# Patient Record
Sex: Female | Born: 1935 | Race: White | Hispanic: No | Marital: Married | Smoking: Former smoker
Health system: Southern US, Community
[De-identification: ages and names within clinical notes are randomized; demographics above are authoritative.]

## PROBLEM LIST (undated history)

## (undated) DIAGNOSIS — H3550 Unspecified hereditary retinal dystrophy: Secondary | ICD-10-CM

## (undated) DIAGNOSIS — I219 Acute myocardial infarction, unspecified: Secondary | ICD-10-CM

## (undated) DIAGNOSIS — C50919 Malignant neoplasm of unspecified site of unspecified female breast: Secondary | ICD-10-CM

## (undated) HISTORY — PX: CORONARY ANGIOPLASTY WITH STENT PLACEMENT: SHX49

## (undated) HISTORY — PX: CORNEAL TRANSPLANT: SHX108

---

## 2017-07-17 ENCOUNTER — Ambulatory Visit (HOSPITAL_COMMUNITY)
Admission: EM | Admit: 2017-07-17 | Discharge: 2017-07-18 | Disposition: A | Discharge status: Home / Self Care | Payer: Medicare Other | Attending: Surgery | Admitting: Surgery

## 2017-07-17 ENCOUNTER — Emergency Department (HOSPITAL_COMMUNITY): Payer: Medicare Other | Admitting: Anesthesiology

## 2017-07-17 ENCOUNTER — Encounter: Payer: Self-pay | Admitting: Surgery

## 2017-07-17 ENCOUNTER — Emergency Department (HOSPITAL_COMMUNITY): Payer: Medicare Other

## 2017-07-17 ENCOUNTER — Ambulatory Visit: Payer: Self-pay | Admitting: Surgery

## 2017-07-17 ENCOUNTER — Encounter (HOSPITAL_COMMUNITY): Payer: Self-pay | Admitting: Emergency Medicine

## 2017-07-17 ENCOUNTER — Encounter (HOSPITAL_COMMUNITY): Admission: EM | Disposition: A | Discharge status: Home / Self Care | Payer: Self-pay | Source: Home / Self Care

## 2017-07-17 ENCOUNTER — Other Ambulatory Visit: Payer: Self-pay | Admitting: Surgery

## 2017-07-17 DIAGNOSIS — Y93K1 Activity, walking an animal: Secondary | ICD-10-CM | POA: Diagnosis not present

## 2017-07-17 DIAGNOSIS — S42293A Other displaced fracture of upper end of unspecified humerus, initial encounter for closed fracture: Secondary | ICD-10-CM | POA: Insufficient documentation

## 2017-07-17 DIAGNOSIS — W19XXXA Unspecified fall, initial encounter: Secondary | ICD-10-CM | POA: Insufficient documentation

## 2017-07-17 DIAGNOSIS — Z23 Encounter for immunization: Secondary | ICD-10-CM | POA: Insufficient documentation

## 2017-07-17 DIAGNOSIS — Z7982 Long term (current) use of aspirin: Secondary | ICD-10-CM | POA: Diagnosis not present

## 2017-07-17 DIAGNOSIS — H3550 Unspecified hereditary retinal dystrophy: Secondary | ICD-10-CM | POA: Diagnosis not present

## 2017-07-17 DIAGNOSIS — S0531XA Ocular laceration without prolapse or loss of intraocular tissue, right eye, initial encounter: Secondary | ICD-10-CM

## 2017-07-17 DIAGNOSIS — I252 Old myocardial infarction: Secondary | ICD-10-CM | POA: Insufficient documentation

## 2017-07-17 DIAGNOSIS — Z947 Corneal transplant status: Secondary | ICD-10-CM | POA: Diagnosis not present

## 2017-07-17 DIAGNOSIS — Z853 Personal history of malignant neoplasm of breast: Secondary | ICD-10-CM | POA: Insufficient documentation

## 2017-07-17 DIAGNOSIS — Z87891 Personal history of nicotine dependence: Secondary | ICD-10-CM | POA: Diagnosis not present

## 2017-07-17 DIAGNOSIS — S42291A Other displaced fracture of upper end of right humerus, initial encounter for closed fracture: Secondary | ICD-10-CM

## 2017-07-17 DIAGNOSIS — Z955 Presence of coronary angioplasty implant and graft: Secondary | ICD-10-CM | POA: Diagnosis not present

## 2017-07-17 DIAGNOSIS — Z79899 Other long term (current) drug therapy: Secondary | ICD-10-CM | POA: Diagnosis not present

## 2017-07-17 DIAGNOSIS — S0530XA Ocular laceration without prolapse or loss of intraocular tissue, unspecified eye, initial encounter: Secondary | ICD-10-CM | POA: Insufficient documentation

## 2017-07-17 HISTORY — PX: RUPTURED GLOBE EXPLORATION AND REPAIR: SHX2366

## 2017-07-17 HISTORY — DX: Acute myocardial infarction, unspecified: I21.9

## 2017-07-17 HISTORY — DX: Malignant neoplasm of unspecified site of unspecified female breast: C50.919

## 2017-07-17 HISTORY — DX: Unspecified hereditary retinal dystrophy: H35.50

## 2017-07-17 SURGERY — REPAIR, RUPTURE, GLOBE
Anesthesia: General | Site: Eye | Laterality: Right

## 2017-07-17 MED ORDER — FENTANYL CITRATE (PF) 250 MCG/5ML IJ SOLN
INTRAMUSCULAR | Status: AC
  Filled 2017-07-17: qty 5

## 2017-07-17 MED ORDER — PROPOFOL 10 MG/ML IV BOLUS
INTRAVENOUS | Status: DC | PRN
  Administered 2017-07-17: 100 mg via INTRAVENOUS

## 2017-07-17 MED ORDER — LEVOFLOXACIN IN D5W 750 MG/150ML IV SOLN
750.0000 mg | Freq: Once | INTRAVENOUS | Status: AC
  Administered 2017-07-17: 750 mg via INTRAVENOUS
  Filled 2017-07-17: qty 150

## 2017-07-17 MED ORDER — SODIUM HYALURONATE 10 MG/ML IO SOLN
INTRAOCULAR | Status: AC
  Filled 2017-07-17: qty 0.85

## 2017-07-17 MED ORDER — BUPIVACAINE HCL (PF) 0.75 % IJ SOLN
INTRAMUSCULAR | Status: AC
  Filled 2017-07-17: qty 30

## 2017-07-17 MED ORDER — BSS PLUS IO SOLN: INTRAOCULAR | Status: DC | PRN

## 2017-07-17 MED ORDER — DEXAMETHASONE SODIUM PHOSPHATE 10 MG/ML IJ SOLN
INTRAMUSCULAR | Status: DC | PRN
  Administered 2017-07-17: 10 mg via INTRAVENOUS

## 2017-07-17 MED ORDER — ROCURONIUM BROMIDE 100 MG/10ML IV SOLN
INTRAVENOUS | Status: DC | PRN
  Administered 2017-07-17: 50 mg via INTRAVENOUS

## 2017-07-17 MED ORDER — MEPERIDINE HCL 25 MG/ML IJ SOLN: 6.2500 mg | INTRAMUSCULAR | Status: DC | PRN

## 2017-07-17 MED ORDER — SUGAMMADEX SODIUM 200 MG/2ML IV SOLN
INTRAVENOUS | Status: DC | PRN
  Administered 2017-07-17: 200 mg via INTRAVENOUS

## 2017-07-17 MED ORDER — DEXAMETHASONE SODIUM PHOSPHATE 10 MG/ML IJ SOLN
INTRAMUSCULAR | Status: AC
  Filled 2017-07-17: qty 1

## 2017-07-17 MED ORDER — CEFAZOLIN SODIUM-DEXTROSE 1-4 GM/50ML-% IV SOLN
1.0000 g | Freq: Once | INTRAVENOUS | Status: DC
  Filled 2017-07-17: qty 50

## 2017-07-17 MED ORDER — SUGAMMADEX SODIUM 200 MG/2ML IV SOLN
INTRAVENOUS | Status: AC
  Filled 2017-07-17: qty 2

## 2017-07-17 MED ORDER — TOBRAMYCIN-DEXAMETHASONE 0.3-0.1 % OP OINT
TOPICAL_OINTMENT | OPHTHALMIC | Status: AC
  Filled 2017-07-17: qty 3.5

## 2017-07-17 MED ORDER — HYDROMORPHONE HCL 1 MG/ML IJ SOLN: 0.2500 mg | INTRAMUSCULAR | Status: DC | PRN

## 2017-07-17 MED ORDER — PHENYLEPHRINE HCL 10 MG/ML IJ SOLN
INTRAVENOUS | Status: DC | PRN
  Administered 2017-07-17: 30 ug/min via INTRAVENOUS

## 2017-07-17 MED ORDER — FLUORESCEIN SODIUM 0.6 MG OP STRP
ORAL_STRIP | OPHTHALMIC | Status: AC
  Filled 2017-07-17: qty 1

## 2017-07-17 MED ORDER — FENTANYL CITRATE (PF) 100 MCG/2ML IJ SOLN
INTRAMUSCULAR | Status: DC | PRN
  Administered 2017-07-17: 150 ug via INTRAVENOUS

## 2017-07-17 MED ORDER — ROCURONIUM BROMIDE 10 MG/ML (PF) SYRINGE
PREFILLED_SYRINGE | INTRAVENOUS | Status: AC
  Filled 2017-07-17: qty 5

## 2017-07-17 MED ORDER — LACTATED RINGERS IV SOLN
INTRAVENOUS | Status: DC | PRN
  Administered 2017-07-17: 21:00:00 via INTRAVENOUS

## 2017-07-17 MED ORDER — GATIFLOXACIN 0.5 % OP SOLN
OPHTHALMIC | Status: AC
  Filled 2017-07-17: qty 2.5

## 2017-07-17 MED ORDER — ONDANSETRON HCL 4 MG/2ML IJ SOLN
INTRAMUSCULAR | Status: AC
  Filled 2017-07-17: qty 2

## 2017-07-17 MED ORDER — PHENYLEPHRINE HCL 10 MG/ML IJ SOLN
INTRAMUSCULAR | Status: DC | PRN
  Administered 2017-07-17 (×2): 80 ug via INTRAVENOUS

## 2017-07-17 MED ORDER — LIDOCAINE HCL (CARDIAC) 20 MG/ML IV SOLN
INTRAVENOUS | Status: DC | PRN
  Administered 2017-07-17: 100 mg via INTRAVENOUS

## 2017-07-17 MED ORDER — ONDANSETRON HCL 4 MG/2ML IJ SOLN
INTRAMUSCULAR | Status: DC | PRN
  Administered 2017-07-17: 4 mg via INTRAVENOUS

## 2017-07-17 MED ORDER — BSS IO SOLN
INTRAOCULAR | Status: AC
  Filled 2017-07-17: qty 500

## 2017-07-17 MED ORDER — BSS IO SOLN
INTRAOCULAR | Status: AC
  Filled 2017-07-17: qty 15

## 2017-07-17 MED ORDER — LIDOCAINE 2% (20 MG/ML) 5 ML SYRINGE
INTRAMUSCULAR | Status: AC
  Filled 2017-07-17: qty 5

## 2017-07-17 MED ORDER — PROPOFOL 10 MG/ML IV BOLUS
INTRAVENOUS | Status: AC
  Filled 2017-07-17: qty 20

## 2017-07-17 MED ORDER — PHENYLEPHRINE 40 MCG/ML (10ML) SYRINGE FOR IV PUSH (FOR BLOOD PRESSURE SUPPORT)
PREFILLED_SYRINGE | INTRAVENOUS | Status: AC
  Filled 2017-07-17: qty 10

## 2017-07-17 MED ORDER — FENTANYL CITRATE (PF) 100 MCG/2ML IJ SOLN
50.0000 ug | Freq: Once | INTRAMUSCULAR | Status: AC
  Administered 2017-07-17: 50 ug via INTRAVENOUS
  Filled 2017-07-17: qty 2

## 2017-07-17 MED ORDER — 0.9 % SODIUM CHLORIDE (POUR BTL) OPTIME
TOPICAL | Status: DC | PRN
  Administered 2017-07-17: 1000 mL

## 2017-07-17 MED ORDER — FLUORESCEIN SODIUM 1 MG OP STRP
ORAL_STRIP | OPHTHALMIC | Status: DC | PRN
  Administered 2017-07-17: 1 via OPHTHALMIC

## 2017-07-17 MED ORDER — TETANUS-DIPHTH-ACELL PERTUSSIS 5-2.5-18.5 LF-MCG/0.5 IM SUSP
0.5000 mL | Freq: Once | INTRAMUSCULAR | Status: AC
  Administered 2017-07-17: 0.5 mL via INTRAMUSCULAR
  Filled 2017-07-17: qty 0.5

## 2017-07-17 MED ORDER — SODIUM HYALURONATE 10 MG/ML IO SOLN
INTRAOCULAR | Status: DC | PRN
  Administered 2017-07-17 (×2): 0.85 mL via INTRAOCULAR

## 2017-07-17 MED ORDER — BSS IO SOLN
INTRAOCULAR | Status: DC | PRN
  Administered 2017-07-17 (×2): 15 mL via INTRAOCULAR

## 2017-07-17 MED ORDER — ONDANSETRON HCL 4 MG/2ML IJ SOLN
4.0000 mg | Freq: Once | INTRAMUSCULAR | Status: AC | PRN
  Administered 2017-07-18: 4 mg via INTRAVENOUS

## 2017-07-17 SURGICAL SUPPLY — 9 items
DRAPE OPHTHALMIC 77X100 STRL (CUSTOM PROCEDURE TRAY) ×3 IMPLANT
GLOVE BIO SURGEON STRL SZ7 (GLOVE) ×6 IMPLANT
GLOVE BIOGEL PI IND STRL 7.0 (GLOVE) ×1 IMPLANT
GLOVE BIOGEL PI INDICATOR 7.0 (GLOVE) ×2
GLOVE SURG SS PI 7.0 STRL IVOR (GLOVE) ×3 IMPLANT
STOCKINETTE 6  STRL (DRAPES) ×4
STOCKINETTE 6 STRL (DRAPES) ×2 IMPLANT
SUT ETHILON 10 0 BV100 4 (SUTURE) ×12 IMPLANT
SUT ETHILON 10 0 BV75 4 (SUTURE) ×24 IMPLANT

## 2017-07-17 NOTE — Progress Notes (Signed)
Orthopedic Tech Progress Note Patient Details:  Katie Nguyen [birth year] [id number]  Ortho Devices Type of Ortho Device: Sling immobilizer Ortho Device/Splint Location: rue Ortho Device/Splint Interventions: Ordered, Application, Adjustment   Post Interventions Patient Tolerated: Well Instructions Provided: Care of device, Adjustment of device   Karolee Stamps 07/17/2017, 8:38 PM

## 2017-07-17 NOTE — Consult Note (Signed)
CC:  Chief Complaint  Patient presents with  . Fall  . Eye injury    HPI: Katie Nguyen is a 83 y.o. 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   Reportedly, she was yanked by her dog to the right and fell on her right shoulder and hit her face.   There is bleeding and vision is described as "seeing red."  ROS: Denies fever/chills, unintentional weight loss, chest pain, irregular heart rhythm, SOB, cough, wheezing, abdominal pain, melena, hematochezia, weakness, numbness, slurring of speech, facial droop, muscle weakness, joint pain, skin rash, tattoos, depressed mood  PMH: Past Medical History:  Diagnosis Date  . Breast cancer ([place])    Left  . MI (myocardial infarction) ([facility])   . 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 CORNEAL TRANSPLANT Bilateral   . CORONARY ANGIOPLASTY WITH STENT PLACEMENT      Meds: No current facility-administered medications on file prior to encounter.    No current outpatient medications on file prior to encounter.    SH: Social History   Socioeconomic History  . Marital status: Married    Spouse name: None  . Number of children: None  . Years of education: None  . Highest education level: None  Social Needs  . Financial resource strain: None  . Food insecurity - worry: None  . Food insecurity - inability: None  . Transportation needs - medical: None  . Transportation needs - non-medical: None  Occupational History  . None  Tobacco Use  . Smoking status: Former Research scientist (life sciences)  . Smokeless tobacco: Never Used  Substance and Sexual Activity  . Alcohol use: Yes    Comment: socially, one drink a day  . Drug use: No  . Sexual activity: None  Other Topics Concern  . None  Social History Narrative  . None    FH: History reviewed. No pertinent family history.  Exam:  Lucianne Lei: OD:  HM @ 39feet  OS: 16 pt ([place])  CVF: OD: Unable OS: full  EOM: OD: full d/v OS: full d/v  Pupils: OD: Irregular, non-reactive, no APD by reverse OS: 3->2 mm, no APD  IOP: by [place] OD: deferred OS: 10  External: OD: 1+ periorbital edema, no proptosis, +enophthalmos OS: no periorbital edema, no proptosis, V1-V3 intact and symmetric, good orbicularis strength  Pen Light Exam: L/L: OD: WNL, no lacerations OS: WNL  C/S: OD: white and quiet, no obvious conjunctival laceration OS: white and quiet  K: OD: mucus and blood tinged, hazy, open - corneal button dislocated inferiorly OS: clear, no abnormal staining  A/C: OD: malformed, shallow OS: grossly deep and quiet appearing by pen light  I: OD: iris to the wound superiorly and inferiorly OS: round and regular  L: OD: IOL (uncertain location) OS: PCIOL  DFE: dilated @ 7:50  w/ Tropic and Phenyl OS  V: OD: deferred OS: clear  N: OD: deferred OS: C/D 0.55, no disc edema  M: OD: deferred OS: flat, no obvious macular pathology  V: OD: deferred OS: normal appearing vessels  P: OD: deferred OS: retina flat 360, no obvious mass/RT/RD   CT Head/Face (07/17/17): - Air in the globe with deformed right globe  - No obvious bony deformities  A/P:  1. Ruptured Globe, Right eye - History of PK with corneal button dislocated inferiorly -  NPO since 2PM - CT reveals air in the anterior segment - Will need to go to the OR emergently for globe repair   - Discussed R/B/A of globe repair with the patient at length, she is agreeable and willing to proceed.

## 2017-07-17 NOTE — ED Triage Notes (Signed)
Pt presents to ED for assessment after being yanked by her dog to the right falling on the right shoulder and hitting her face.  Recent fall as well from a leg cramp with an old bruise to the right forehead.  Pt's eye is injured, EMS has it shielded at this time.  States right eye is bleeding and oozing, is smaller in volume than the left, and loss of vision ("all I see if veiny red stuff").  Pt also c/o right shoulder pain with some deformity.

## 2017-07-17 NOTE — H&P (Signed)
Katie Nguyen is an 83 y.o. female.   Chief Complaint: Ruptured Globe, right eye HPI: Katie Nguyen is a 83 y.o. female with POHx of PK OU and CE/IOL OU who fell on her right side today. She presented to the ER with seeing red OD. Examination with CT imaging revealed ruptured globe of the right eye. No recent chest pain, SOB.   Past Medical History:  Diagnosis Date  . Breast cancer ([facility])    Left  . MI (myocardial infarction) ([facility])   . 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 CORNEAL TRANSPLANT Bilateral   . CORONARY ANGIOPLASTY WITH STENT PLACEMENT      No family history on file. Social History:  reports that she has quit smoking. she has never used smokeless tobacco. She reports that she drinks alcohol. She reports that she does not use drugs.  Allergies:  Allergies  Allergen Reactions  . Codeine Hives     (Not in a hospital admission)  No results found for this or any previous visit (from the past 48 hour(s)). Dg Shoulder Right  Result Date: 07/17/2017 CLINICAL DATA:  Pain after trauma EXAM: RIGHT SHOULDER - 2+ VIEW COMPARISON:  None. FINDINGS: There is a comminuted fracture through the humeral head. The humeral head is displaced inferiorly suggesting a joint effusion. Evaluation for dislocation is limited due to a suboptimal transscapular Y-view. However, there is no imaging evidence of dislocation on this study. The scapula and clavicle are intact. Limited views of the right chest are normal. IMPRESSION: Comminuted fracture of the humeral head. Limited evaluation for dislocation. However, no dislocation identified. Electronically Signed   By: Dorise Bullion III M.D   On: 07/17/2017 19:39   Dg Humerus Right  Result Date: 07/17/2017 CLINICAL DATA:  Pain after fall EXAM: RIGHT HUMERUS - 2+ VIEW COMPARISON:  None. FINDINGS: There is a comminuted fracture through the humeral head. The remainder of the humerus is  intact. Limited views of the proximal radius and ulna demonstrate no definitive abnormality. Dedicated images would be more sensitive however. No joint effusions seen in the elbow. IMPRESSION: Comminuted fracture through the humeral head. No other humeral fractures identified. Electronically Signed   By: Dorise Bullion III M.D   On: 07/17/2017 19:40    ROS   - Please see consult note.   There were no vitals taken for this visit. Physical Exam   Constitutional: AAO X3 HEENT: Ruptured globe, right eye CVS: S1, S2 Lungs: CTAB Abd: Soft Extremities: No TTP  Assessment/Plan 1. Ruptured Globe, right eye  - Recommend emergent repair of globe rupture (history of PK) - Discussed R/B/A of repair with patient, who is willing to proceed - Reviewed likely potential for poor visual outcome with globe rupture even with repair  Marshall Cork, MD,[facility] Ophthalmology [phone]  Hortencia Pilar, MD 07/17/2017, 7:49 PM

## 2017-07-17 NOTE — Transfer of Care (Signed)
Immediate Anesthesia Transfer of Care Note  Patient: Katie Nguyen  Procedure(s) Performed: REPAIR OF RUPTURED GLOBE (Right Eye)  Patient Location: PACU  Anesthesia Type:General  Level of Consciousness: awake, alert  and oriented  Airway & Oxygen Therapy: Patient Spontanous Breathing and Patient connected to nasal cannula oxygen  Post-op Assessment: Report given to RN and Post -op Vital signs reviewed and stable  Post vital signs: Reviewed and stable  Last Vitals:  Vitals:   07/17/17 1845 07/17/17 1900  BP:    Pulse: 76 70  Resp:    Temp:    SpO2: 99% 98%    Last Pain:  Vitals:   07/17/17 2024  TempSrc:   PainSc: 2          Complications: No apparent anesthesia complications

## 2017-07-17 NOTE — ED Notes (Signed)
Ortho aware of need for splinting

## 2017-07-17 NOTE — Anesthesia Procedure Notes (Signed)
Procedure Name: Intubation Date/Time: 07/17/2017 9:46 PM Performed by: Babs Bertin, CRNA Pre-anesthesia Checklist: Patient identified, Emergency Drugs available, Suction available and Patient being monitored Patient Re-evaluated:Patient Re-evaluated prior to induction Oxygen Delivery Method: Circle System Utilized Preoxygenation: Pre-oxygenation with 100% oxygen Induction Type: IV induction Ventilation: Mask ventilation without difficulty Laryngoscope Size: Mac and 3 Grade View: Grade I Tube type: Oral Tube size: 7.0 mm Number of attempts: 1 Airway Equipment and Method: Stylet and Oral airway Placement Confirmation: ETT inserted through vocal cords under direct vision,  positive ETCO2 and breath sounds checked- equal and bilateral Secured at: 20 cm Tube secured with: Tape Dental Injury: Teeth and Oropharynx as per pre-operative assessment

## 2017-07-17 NOTE — Discharge Instructions (Signed)
Please make sure you wear the sling and follow-up with orthopedic doctor.  Have given you a referral to Dr. Lorin Mercy we talked today about the break in her shoulder.

## 2017-07-17 NOTE — ED Notes (Signed)
Pt continues to be in CT

## 2017-07-17 NOTE — ED Provider Notes (Signed)
[facility] Provider Note   CSN: [id number] Arrival date & time: 07/17/17  1821     History   Chief Complaint Chief Complaint  Patient presents with  . Fall  . Eye injury    HPI Katie Nguyen is a 83 y.o. female.  HPI 83 year old Caucasian female past medical history significant for MI, bilateral corneal transplant that presents to the emergency department today for evaluation following a fall.  Patient states that she was walking her dog when the dog yanked her causing her to fall and land onto her right shoulder and the right side of her head and face.  Patient brought in by EMS who notes that patient had blood coming from her right eye.  Patient reports no vision in her right eye.  She also reports some pain with palpation range of motion right shoulder.  Denies any other pain.  Patient is from out of town and visiting family.  Patient does not take anything for the pain prior to arrival.  Denies any associated paresthesias or weakness of the right arm.  She denies any headaches, lightheadedness, dizziness, chest pain, abdominal pain, lower extremity paresthesias, back pain, neck pain. Past Medical History:  Diagnosis Date  . Breast cancer ([place])    Left  . MI (myocardial infarction) ([place])   . 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 Ruptured globe 07/17/2017    Past Surgical History:  Procedure Laterality Date  . CORNEAL TRANSPLANT Bilateral   . CORONARY ANGIOPLASTY WITH STENT PLACEMENT      OB History    No data available       Home Medications    Prior to Admission medications   Medication Sig Start Date End Date Taking? Authorizing Provider  aspirin EC 81 MG tablet Take 81 mg by mouth daily.   Yes [provider]  cholecalciferol (VITAMIN D) 1000 units tablet Take 1,000 Units by mouth daily.   Yes [provider]  gabapentin (NEURONTIN) 100 MG capsule Take 100  mg by mouth 3 (three) times daily. 07/08/17  Yes [provider]  levothyroxine (SYNTHROID, LEVOTHROID) 88 MCG tablet Take 88 mcg by mouth at bedtime. 05/25/17  Yes [provider]  metoprolol succinate (TOPROL-XL) 25 MG 24 hr tablet Take 25 mg by mouth at bedtime. 07/08/17  Yes [provider]  Multiple Vitamin (MULTIVITAMIN) tablet Take 1 tablet by mouth daily.   Yes [provider]  omega-3 acid ethyl esters (LOVAZA) 1 g capsule Take 1 g by mouth daily.   Yes [provider]  rosuvastatin (CRESTOR) 20 MG tablet Take 20 mg by mouth at bedtime. 07/08/17  Yes [provider]  vitamin B-12 (CYANOCOBALAMIN) 500 MCG tablet Take 100 mcg by mouth daily.   Yes [provider]  zolpidem (AMBIEN) 5 MG tablet Take 5 mg by mouth at bedtime as needed for sleep.   Yes [provider]    Family History History reviewed. No pertinent family history.  Social History Social History   Tobacco Use  . Smoking status: Former Research scientist (life sciences)  . Smokeless tobacco: Never Used  Substance Use Topics  . Alcohol use: Yes    Comment: socially, one drink a day  . Drug use: No     Allergies   Codeine   Review of Systems Review of Systems  Constitutional: Negative for chills and fever.  HENT: Negative for congestion.  Eyes: Positive for pain, discharge and visual disturbance.  Respiratory: Negative for cough and shortness of breath.   Cardiovascular: Negative for chest pain.  Gastrointestinal: Negative for abdominal pain, diarrhea, nausea and vomiting.  Genitourinary: Negative for dysuria, flank pain, frequency, hematuria and urgency.  Musculoskeletal: Positive for arthralgias and joint swelling. Negative for myalgias.  Skin: Negative for rash.  Neurological: Negative for dizziness, syncope, weakness, light-headedness, numbness and headaches.  Psychiatric/Behavioral: Negative for sleep disturbance. The patient is not nervous/anxious.       Physical Exam Updated Vital Signs BP (!) 153/71 (BP Location: Left Leg)   Pulse 89   Temp 97.7 F (36.5 C)   Resp 11   SpO2 100%   Physical Exam  Constitutional: She is oriented to person, place, and time. She appears well-developed and well-nourished.  Non-toxic appearance. No distress.  HENT:  Head: Normocephalic and atraumatic.  Nose: Nose normal.  Mouth/Throat: Oropharynx is clear and moist.  No bilateral hemotympanum.  No septal hematoma.  No skull depression.  Eyes: Pupils are equal, round, and reactive to light. Right eye exhibits no discharge. Left eye exhibits no discharge.  Left eye was unremarkable.  Right eye with enophthalmos bloody mucous drainage from the right eye. Unable to visualize lense.  Patient has no vision in the right eye.  She has no pain or photophobia.  No consensual photophobia.  Neck: Normal range of motion. Neck supple.  No c spine midline tenderness. No paraspinal tenderness. No deformities or step offs noted. Full ROM. Supple. No nuchal rigidity.    Cardiovascular: Normal rate, regular rhythm, normal heart sounds and intact distal pulses. Exam reveals no gallop and no friction rub.  No murmur heard. Pulmonary/Chest: Effort normal and breath sounds normal. No stridor. No respiratory distress. She has no wheezes. She has no rales. She exhibits no tenderness.  No crepitus noted.  Abdominal: Soft. Bowel sounds are normal. There is no tenderness. There is no rigidity, no rebound, no guarding, no CVA tenderness, no tenderness at McBurney's point and negative Murphy's sign.  No ecchymosis noted.  Musculoskeletal:       Right shoulder: She exhibits decreased range of motion, tenderness, bony tenderness, swelling and deformity. She exhibits no effusion, no crepitus, normal pulse and normal strength.  No midline T spine or L spine tenderness. No deformities or step offs noted. Full ROM. Pelvis is stable.  Range of motion of all extremities.  Patient does  have obvious deformity to the right shoulder.  Ecchymosis noted.  Patient has some range of motion.  However range of motion does cause her pain.  Full range of motion of the right elbow without pain.  She has radial pulses are 2+ bilaterally.  Brisk cap refill.  Grip strength normal.  Sensation intact.  Patient with normal axillary nerve sensation.  Lymphadenopathy:    She has no cervical adenopathy.  Neurological: She is alert and oriented to person, place, and time.  The patient is alert, attentive, and oriented x 3. Speech is clear. Cranial nerve II-VII grossly intact. Negative pronator drift. Sensation intact. Strength 5/5 in all extremities. Reflexes 2+ and symmetric at biceps, triceps, knees, and ankles. Rapid alternating movement and fine finger movements intact. Romberg is absent. Posture and gait normal.   Skin: Skin is warm and dry. Capillary refill takes less than 2 seconds.  Psychiatric: Her behavior is normal. Judgment and thought content normal.  Nursing note and vitals reviewed.     ED Treatments / Results  Labs (all labs  ordered are listed, but only abnormal results are displayed) Labs Reviewed - No data to display  EKG  EKG Interpretation None       Radiology Dg Shoulder Right  Result Date: 07/17/2017 CLINICAL DATA:  Pain after trauma EXAM: RIGHT SHOULDER - 2+ VIEW COMPARISON:  None. FINDINGS: There is a comminuted fracture through the humeral head. The humeral head is displaced inferiorly suggesting a joint effusion. Evaluation for dislocation is limited due to a suboptimal transscapular Y-view. However, there is no imaging evidence of dislocation on this study. The scapula and clavicle are intact. Limited views of the right chest are normal. IMPRESSION: Comminuted fracture of the humeral head. Limited evaluation for dislocation. However, no dislocation identified. Electronically Signed   By: Dorise Bullion III M.D   On: 07/17/2017 19:39   Ct Head Wo  Contrast  Result Date: 07/17/2017 CLINICAL DATA:  Fall.  Pain, swelling over right orbit. EXAM: CT HEAD WITHOUT CONTRAST CT MAXILLOFACIAL WITHOUT CONTRAST CT CERVICAL SPINE WITHOUT CONTRAST TECHNIQUE: Multidetector CT imaging of the head, cervical spine, and maxillofacial structures were performed using the standard protocol without intravenous contrast. Multiplanar CT image reconstructions of the cervical spine and maxillofacial structures were also generated. COMPARISON:  None. FINDINGS: CT HEAD FINDINGS Brain: No acute intracranial abnormality. Specifically, no hemorrhage, hydrocephalus, mass lesion, acute infarction, or significant intracranial injury. Vascular: No hyperdense vessel or unexpected calcification. Skull: No acute calvarial abnormality. Other: None CT MAXILLOFACIAL FINDINGS Osseous: No fracture or mandibular dislocation. No destructive process. Orbits: The right globe is irregular and collapsed. Gas noted anteriorly within the right globe. No orbital fracture. Sinuses: Clear Soft tissues: Negative CT CERVICAL SPINE FINDINGS Alignment: 4 mm anterolisthesis of C7 on T1 related to facet disease. Skull base and vertebrae: No fracture. Soft tissues and spinal canal: Prevertebral soft tissues are normal. No epidural or paraspinal hematoma. Disc levels: Moderate advanced degenerative facet disease bilaterally. Degenerative disc disease, most pronounced C5-6 through C7-T1. Upper chest: No acute findings. Other: No acute findings. IMPRESSION: No acute intracranial abnormality. Irregular, collapsed right globe. Gas noted anteriorly within the collapsed right globe. No fracture in the face or cervical spine. Electronically Signed   By: Rolm Baptise M.D.   On: 07/17/2017 20:06   Ct Cervical Spine Wo Contrast  Result Date: 07/17/2017 CLINICAL DATA:  Fall.  Pain, swelling over right orbit. EXAM: CT HEAD WITHOUT CONTRAST CT MAXILLOFACIAL WITHOUT CONTRAST CT CERVICAL SPINE WITHOUT CONTRAST TECHNIQUE:  Multidetector CT imaging of the head, cervical spine, and maxillofacial structures were performed using the standard protocol without intravenous contrast. Multiplanar CT image reconstructions of the cervical spine and maxillofacial structures were also generated. COMPARISON:  None. FINDINGS: CT HEAD FINDINGS Brain: No acute intracranial abnormality. Specifically, no hemorrhage, hydrocephalus, mass lesion, acute infarction, or significant intracranial injury. Vascular: No hyperdense vessel or unexpected calcification. Skull: No acute calvarial abnormality. Other: None CT MAXILLOFACIAL FINDINGS Osseous: No fracture or mandibular dislocation. No destructive process. Orbits: The right globe is irregular and collapsed. Gas noted anteriorly within the right globe. No orbital fracture. Sinuses: Clear Soft tissues: Negative CT CERVICAL SPINE FINDINGS Alignment: 4 mm anterolisthesis of C7 on T1 related to facet disease. Skull base and vertebrae: No fracture. Soft tissues and spinal canal: Prevertebral soft tissues are normal. No epidural or paraspinal hematoma. Disc levels: Moderate advanced degenerative facet disease bilaterally. Degenerative disc disease, most pronounced C5-6 through C7-T1. Upper chest: No acute findings. Other: No acute findings. IMPRESSION: No acute intracranial abnormality. Irregular, collapsed right globe. Gas noted anteriorly within  the collapsed right globe. No fracture in the face or cervical spine. Electronically Signed   By: Rolm Baptise M.D.   On: 07/17/2017 20:06   Dg Humerus Right  Result Date: 07/17/2017 CLINICAL DATA:  Pain after fall EXAM: RIGHT HUMERUS - 2+ VIEW COMPARISON:  None. FINDINGS: There is a comminuted fracture through the humeral head. The remainder of the humerus is intact. Limited views of the proximal radius and ulna demonstrate no definitive abnormality. Dedicated images would be more sensitive however. No joint effusions seen in the elbow. IMPRESSION: Comminuted  fracture through the humeral head. No other humeral fractures identified. Electronically Signed   By: Dorise Bullion III M.D   On: 07/17/2017 19:40   Ct Maxillofacial Wo Cm  Result Date: 07/17/2017 CLINICAL DATA:  Fall.  Pain, swelling over right orbit. EXAM: CT HEAD WITHOUT CONTRAST CT MAXILLOFACIAL WITHOUT CONTRAST CT CERVICAL SPINE WITHOUT CONTRAST TECHNIQUE: Multidetector CT imaging of the head, cervical spine, and maxillofacial structures were performed using the standard protocol without intravenous contrast. Multiplanar CT image reconstructions of the cervical spine and maxillofacial structures were also generated. COMPARISON:  None. FINDINGS: CT HEAD FINDINGS Brain: No acute intracranial abnormality. Specifically, no hemorrhage, hydrocephalus, mass lesion, acute infarction, or significant intracranial injury. Vascular: No hyperdense vessel or unexpected calcification. Skull: No acute calvarial abnormality. Other: None CT MAXILLOFACIAL FINDINGS Osseous: No fracture or mandibular dislocation. No destructive process. Orbits: The right globe is irregular and collapsed. Gas noted anteriorly within the right globe. No orbital fracture. Sinuses: Clear Soft tissues: Negative CT CERVICAL SPINE FINDINGS Alignment: 4 mm anterolisthesis of C7 on T1 related to facet disease. Skull base and vertebrae: No fracture. Soft tissues and spinal canal: Prevertebral soft tissues are normal. No epidural or paraspinal hematoma. Disc levels: Moderate advanced degenerative facet disease bilaterally. Degenerative disc disease, most pronounced C5-6 through C7-T1. Upper chest: No acute findings. Other: No acute findings. IMPRESSION: No acute intracranial abnormality. Irregular, collapsed right globe. Gas noted anteriorly within the collapsed right globe. No fracture in the face or cervical spine. Electronically Signed   By: Rolm Baptise M.D.   On: 07/17/2017 20:06    Procedures Procedures (including critical care  time)  Medications Ordered in ED Medications  ceFAZolin (ANCEF) IVPB 1 g/50 mL premix ( Intravenous MAR Hold 07/17/17 2146)  HYDROmorphone (DILAUDID) injection 0.25-0.5 mg (not administered)  meperidine (DEMEROL) injection 6.25-12.5 mg (not administered)  ondansetron (ZOFRAN) injection 4 mg (not administered)  Tdap (BOOSTRIX) injection 0.5 mL (0.5 mLs Intramuscular Given 07/17/17 1949)  levofloxacin (LEVAQUIN) IVPB 750 mg (750 mg Intravenous New Bag/Given 07/17/17 2021)  fentaNYL (SUBLIMAZE) injection 50 mcg (50 mcg Intravenous Given 07/17/17 1954)     Initial Impression / Assessment and Plan / ED Course  I have reviewed the triage vital signs and the nursing notes.  Pertinent labs & imaging results that were available during my care of the patient were reviewed by me and considered in my medical decision making (see chart for details).     Patient presents to the ED after mechanical fall with complaints of right eye pain and loss of vision along with right shoulder pain.  Patient has no focal neuro deficit.  She is neurovascularly intact in all extremities.  No signs of intrathoracic, intra-abdominal trauma.  Patient is a stable pelvis.  Able to ambulate with normal gait.  On exam patient's vital signs are reassuring.  She does have what appears to be a ruptured right globe.  She also has what appears to be  a deformity to the right shoulder.  CT returned that shows signs of a ruptured globe of the right eye.  She also has a comminuted humeral head fracture with associated joint effusion but no signs of dislocation.  The patient was discussed with Dr. Lorin Mercy with orthopedics by my attending who recommends sling placement and follow-up with orthopedic doctor.  Patient is from out of town however will give her Dr. Lorin Mercy number or encouraged her to follow-up with a orthopedic doctor in her home town.  Ophthalmology was immediately consulted who came to the ED to evaluate patient.  He took  patient to the OR for repair of the ruptured globe.  She has no other acute abnormalities noted on the cervical spine, maxillofacial, head CT.  Patient has no other signs of trauma.  Patient will be discharged from the PACU after repair of a ruptured globe.  All no obvious plan of care was discussed with patient.  Her vital signs remained reassuring.  Able to ambulate with normal gait.  Stable for transfer to the OR this time.  Pt seen and eval by my attending who is agreeable with the above plan.   Final Clinical Impressions(s) / ED Diagnoses   Final diagnoses:  Ruptured globe of right eye, initial encounter    ED Discharge Orders    None       Aaron Edelman 07/18/17 0012    Sherwood Gambler, MD 07/18/17 [phone]

## 2017-07-17 NOTE — ED Notes (Signed)
Opthalmology at bedside.

## 2017-07-17 NOTE — Procedures (Signed)
The patient was seen in the ER with ruptured right globe. The patient was consented for repair of ruptured globe, right eye, and all other associated procedures.  The patient was wheeled into the operating room and underwent general anesthesia with the assistance of anesthesia colleagues. The right eye was marked as the surgical eye. It was carefully prepped with Betadine and draped. A time-out to verify the patient, provider, and procedure was done.   A Lieberman speculum was carefully placed after inspecting the eye. The IOL was found to be expulsed with heme and vitreous inferiorly in the conjunctival cul de sac. Zymaxid was placed onto the corneal surface.  A Weck-cel was used to trim the remaining vitreous and Pro-Visc was instilled into the eye to push any remaining vitreous back. The IOL was removed with .12 forceps and placed in a specimen container. It was intact. A seidel test was done showing a nearly 270-300 degree dehiscence in an eye with previous penetrating keratoplasty. There were no stitches in place. The conjunctiva was inspected and without lacerations.   A 10-0 nylon suture was placed at 12 o'clock. 15 additional sutures were placed, in an interrupted fashion to secure the corneal button to the host tissue. Loose sutures were removed and replaced. Provisc was instilled as needed to maintain the structure and integrity of the globe.   A final seidel test was performed, and was found to be negative. Remaining sutures were rotated to best ability away from the cornea, and long suture tails were trimmed. The eye was formed and the pressure was approximately 10-15 to palpation. There was no vitreous found with Weck-cels. A final seidel test was performed, and was found to be negative.    The drapes were carefully removed and Zymaxid gtts and Tobradex ointment were instilled into the eye, followed by a patch and a shield. The patient was transported to recovery.  I spoke to the patient's  husband, Katie Nguyen, and arranged for them to come to my office tomorrow at 12:15PM. I advised them to leave the patch and shield in place.   Marshall Cork, MD,[facility] [phone]

## 2017-07-17 NOTE — ED Notes (Signed)
MD at bedside. 

## 2017-07-18 ENCOUNTER — Encounter (HOSPITAL_COMMUNITY): Payer: Self-pay | Admitting: Ophthalmology

## 2017-07-18 DIAGNOSIS — S0531XA Ocular laceration without prolapse or loss of intraocular tissue, right eye, initial encounter: Secondary | ICD-10-CM | POA: Diagnosis not present

## 2017-07-18 MED ORDER — TOBRAMYCIN-DEXAMETHASONE 0.3-0.1 % OP OINT
TOPICAL_OINTMENT | OPHTHALMIC | Status: DC | PRN
Start: 2017-07-17 — End: 2017-07-18
  Administered 2017-07-17: 1 via OPHTHALMIC

## 2017-07-18 MED ORDER — GATIFLOXACIN 0.5 % OP SOLN OPTIME - NO CHARGE
OPHTHALMIC | Status: DC | PRN
  Administered 2017-07-17: 1 [drp] via OPHTHALMIC

## 2017-07-18 MED ORDER — ONDANSETRON HCL 4 MG/2ML IJ SOLN
INTRAMUSCULAR | Status: AC
  Filled 2017-07-18: qty 2

## 2017-07-18 NOTE — Anesthesia Preprocedure Evaluation (Addendum)
Anesthesia Evaluation  Patient identified by MRN, date of birth, ID band Patient awake    Reviewed: Allergy & Precautions, NPO status , Patient's Chart, lab work & pertinent test results  Airway Mallampati: I  TM Distance: >3 FB Neck ROM: Full    Dental   Pulmonary former smoker,    Pulmonary exam normal        Cardiovascular + Past MI and + Cardiac Stents  Normal cardiovascular exam     Neuro/Psych    GI/Hepatic   Endo/Other    Renal/GU      Musculoskeletal   Abdominal   Peds  Hematology   Anesthesia Other Findings   Reproductive/Obstetrics                            Anesthesia Physical Anesthesia Plan  ASA: III  Anesthesia Plan: General   Post-op Pain Management:    Induction: Intravenous, Rapid sequence and Cricoid pressure planned  PONV Risk Score and Plan: 2 and Ondansetron and Treatment may vary due to age or medical condition  Airway Management Planned: Oral ETT  Additional Equipment:   Intra-op Plan:   Post-operative Plan: Extubation in OR  Informed Consent: I have reviewed the patients History and Physical, chart, labs and discussed the procedure including the risks, benefits and alternatives for the proposed anesthesia with the patient or authorized representative who has indicated his/her understanding and acceptance.     Plan Discussed with: CRNA and Surgeon  Anesthesia Plan Comments: (Rapid sequence induction with high dose rocuronium.)        Anesthesia Quick Evaluation

## 2017-07-18 NOTE — Anesthesia Postprocedure Evaluation (Signed)
Anesthesia Post Note  Patient: Katie Nguyen  Procedure(s) Performed: REPAIR OF RUPTURED GLOBE (Right Eye)     Patient location during evaluation: PACU Anesthesia Type: General Level of consciousness: awake and alert Pain management: pain level controlled Vital Signs Assessment: post-procedure vital signs reviewed and stable Respiratory status: spontaneous breathing, nonlabored ventilation, respiratory function stable and patient connected to nasal cannula oxygen Cardiovascular status: blood pressure returned to baseline and stable Postop Assessment: no apparent nausea or vomiting Anesthetic complications: no    Last Vitals:  Vitals:   07/18/17 0011 07/18/17 0045  BP: (!) 158/78 (!) 152/78  Pulse: 82 80  Resp: 13 20  Temp: 36.4 C 36.4 C  SpO2: 97% 98%    Last Pain:  Vitals:   07/17/17 2024  TempSrc:   PainSc: 2                  Atlee Villers DAVID

## 2017-07-19 ENCOUNTER — Encounter (HOSPITAL_COMMUNITY): Payer: Self-pay | Admitting: Surgery

## 2019-07-17 IMAGING — CR DG SHOULDER 2+V*R*
2 series · 2 of 2 positions shown · non-contrast
Comparison: None.

CLINICAL DATA: Pain after trauma

EXAM:
RIGHT SHOULDER - 2+ VIEW

[shoulder grashey]
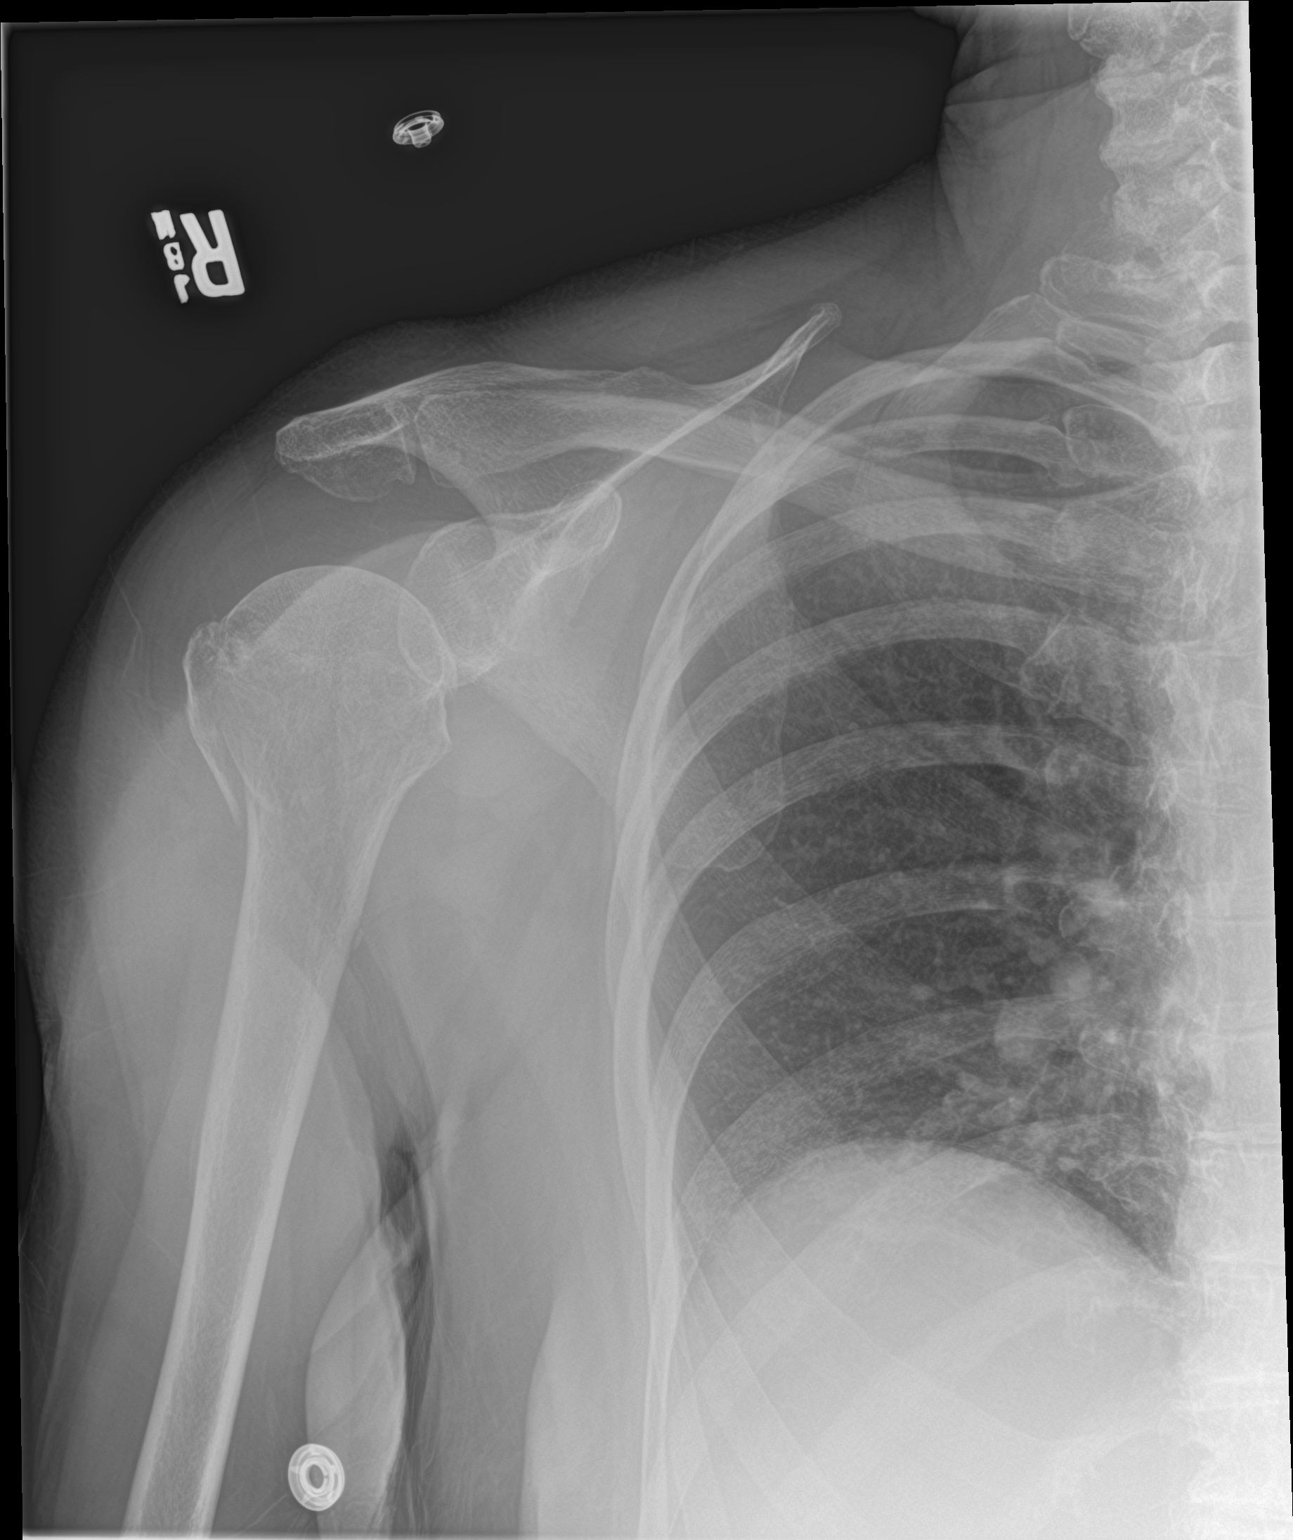

[shoulder y view]
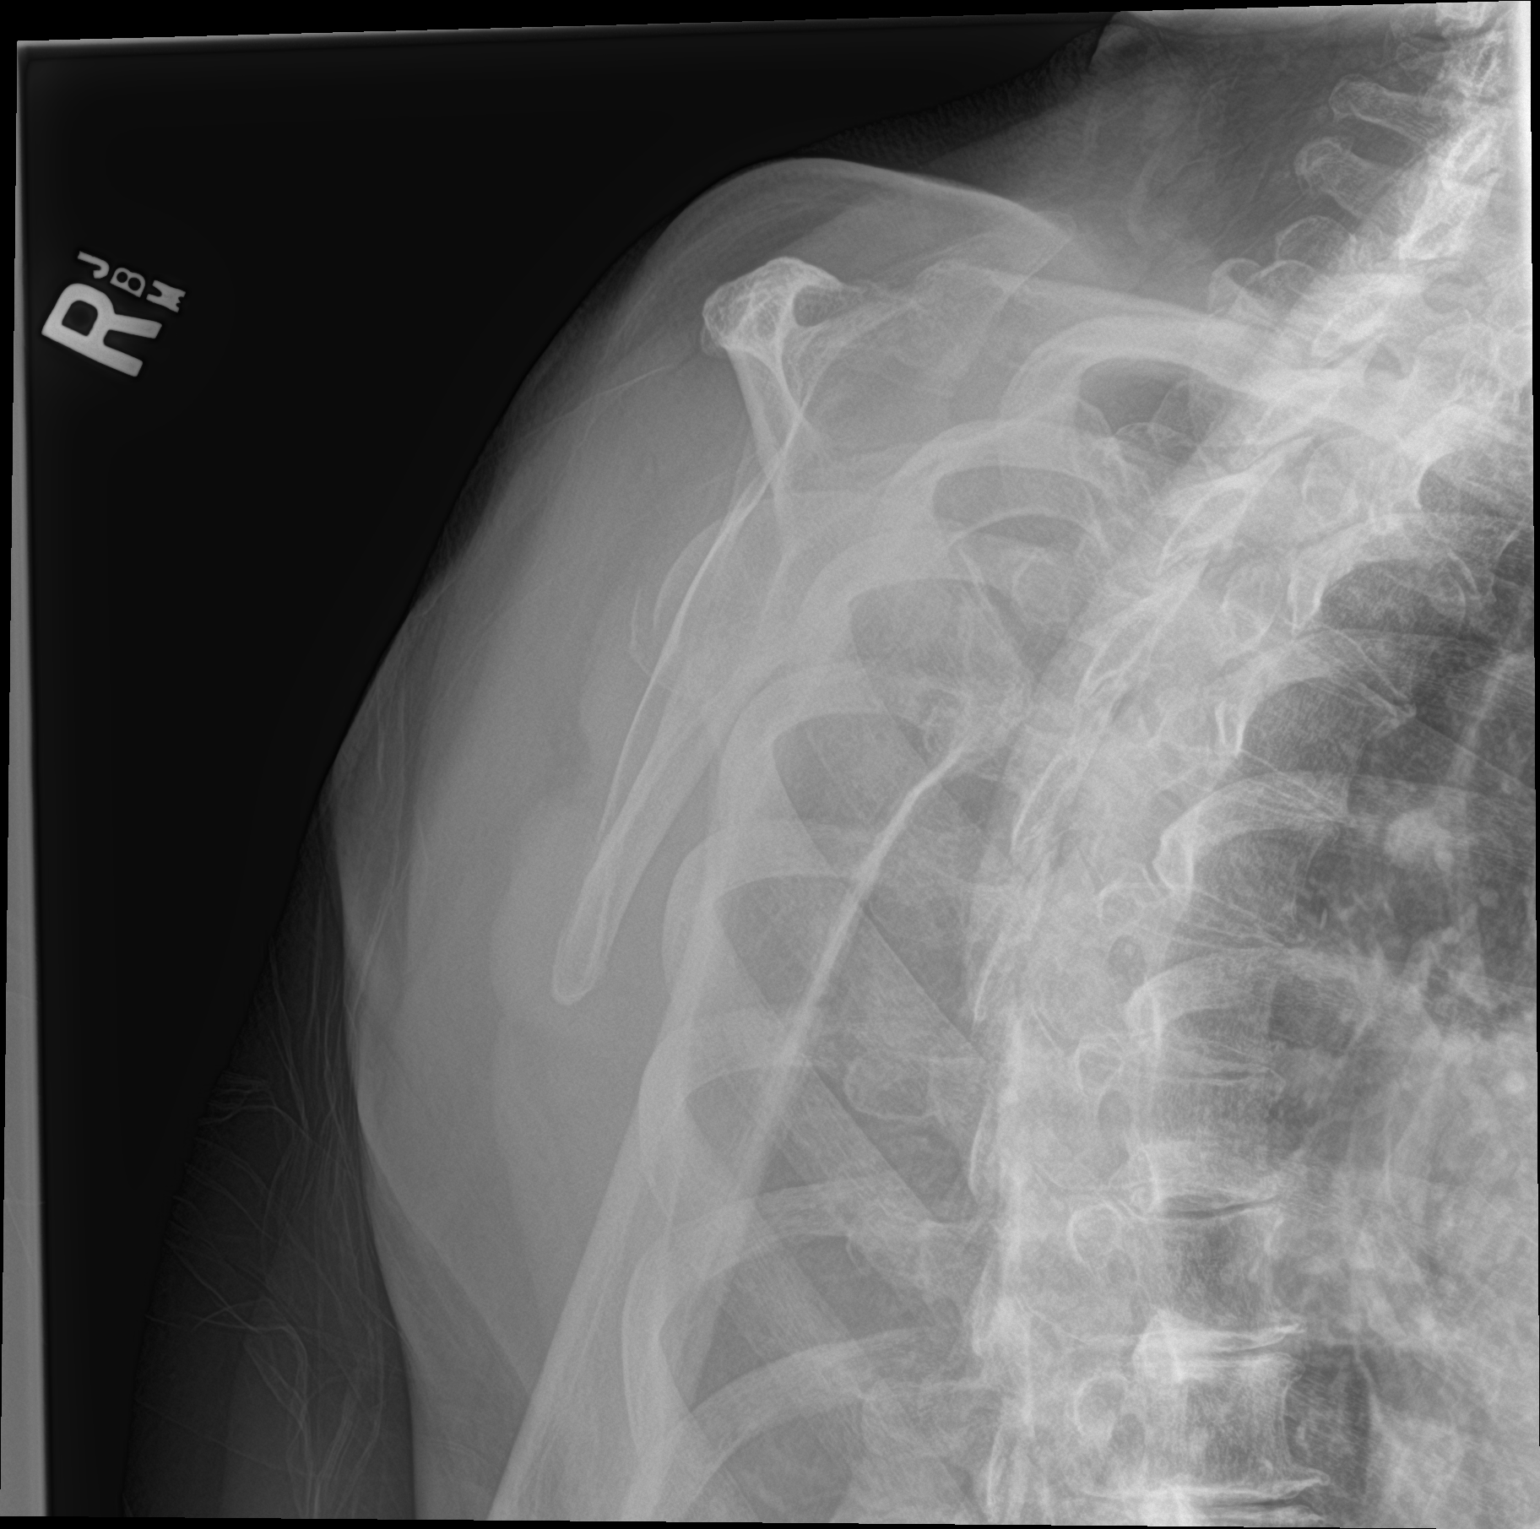

[2 of 2 positions shown; findings below may reference images not displayed]

FINDINGS: There is a comminuted fracture through the humeral head. The humeral
head is displaced inferiorly suggesting a joint effusion. Evaluation
for dislocation is limited due to a suboptimal transscapular Y-view.
However, there is no imaging evidence of dislocation on this study.
The scapula and clavicle are intact. Limited views of the right
chest are normal.
IMPRESSION: Comminuted fracture of the humeral head. Limited evaluation for
dislocation. However, no dislocation identified.

## 2019-07-17 IMAGING — CR DG HUMERUS 2V *R*
2 series · 2 of 2 positions shown · non-contrast
Comparison: None.

CLINICAL DATA: Pain after fall

EXAM:
RIGHT HUMERUS - 2+ VIEW

[humerus ap]
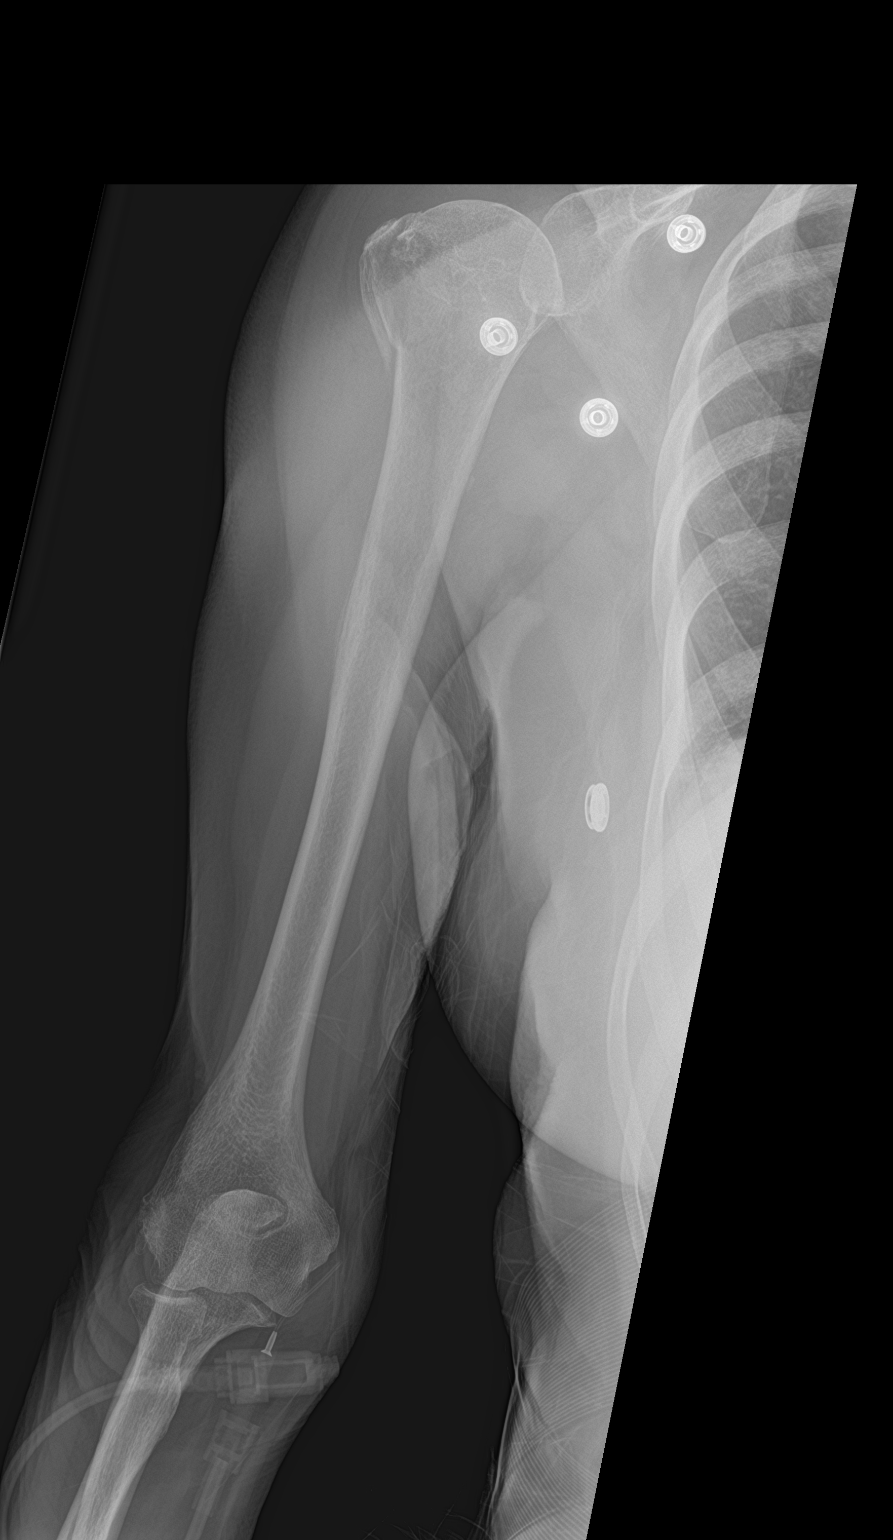

[humerus lat]
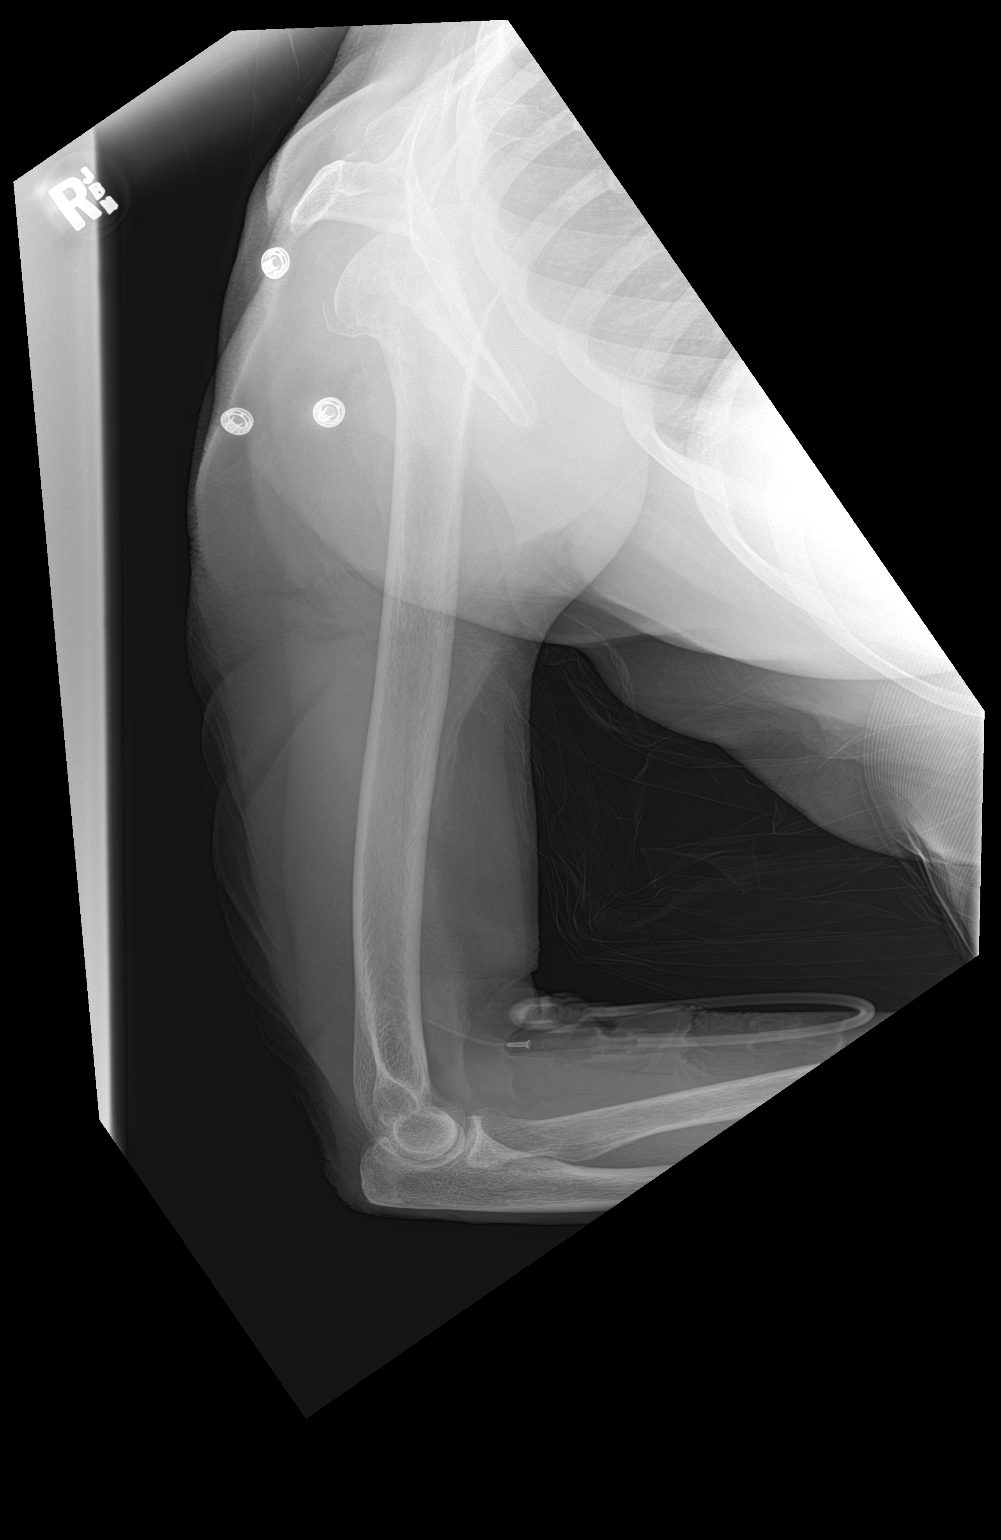

[2 of 2 positions shown; findings below may reference images not displayed]

FINDINGS: There is a comminuted fracture through the humeral head. The
remainder of the humerus is intact. Limited views of the proximal
radius and ulna demonstrate no definitive abnormality. Dedicated
images would be more sensitive however. No joint effusions seen in
the elbow.
IMPRESSION: Comminuted fracture through the humeral head. No other humeral
fractures identified.
